# Patient Record
Sex: Male | Born: 1990 | Race: White | Hispanic: No
Health system: Southern US, Community
[De-identification: ages and names within clinical notes are randomized; demographics above are authoritative.]

---

## 2009-03-20 ENCOUNTER — Encounter: Admission: RE | Admit: 2009-03-20 | Discharge: 2009-03-20 | Payer: Self-pay

## 2010-07-28 IMAGING — CT CT EXTREM LOW W/O CM*R*
4 series · 16 of 33 positions shown, 19 images · non-contrast
Comparison: None

CLINICAL DATA: 18-year-old with right foot pain for 6 months.  No
known injury.  Question tarsal coalition.

CT OF THE RIGHT FOOT WITHOUT CONTRAST
TECHNIQUE: Multidetector CT imaging of the right foot was
performed according to the standard protocol without intravenous
contrast. Multiplanar CT image reconstructions were also generated.

[Series 3: bone · axial · 0.48mm/px · z∈[+575,+655]mm · 5 of 48 slices shown, 7 images]
[im 8/48  soft-tissue]
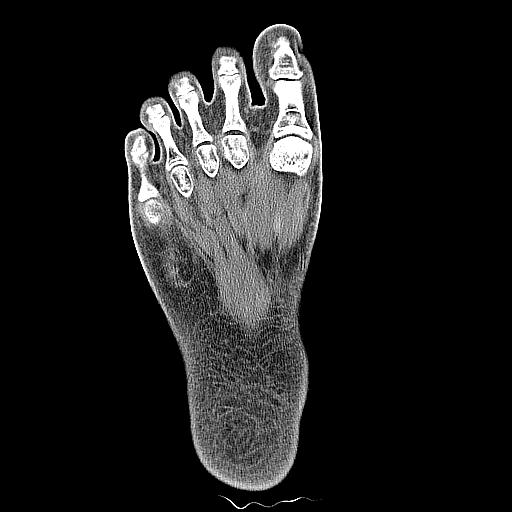
[im 8/48  bone]
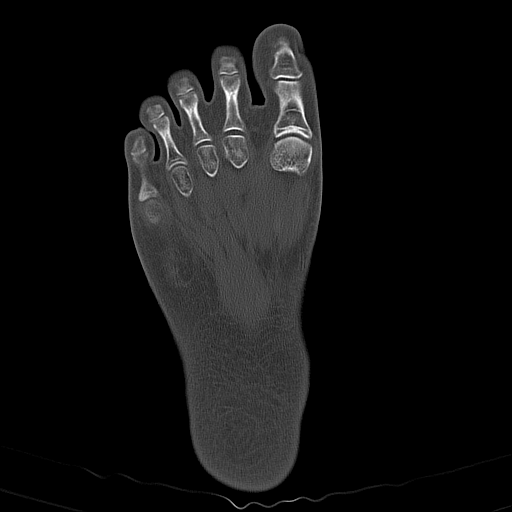
[im 16/48  bone]
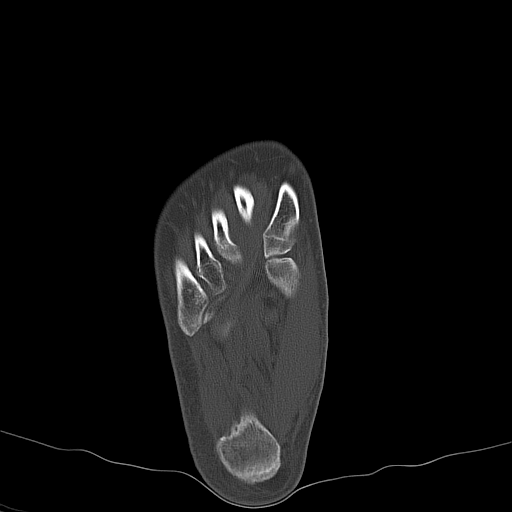
[im 24/48  bone]
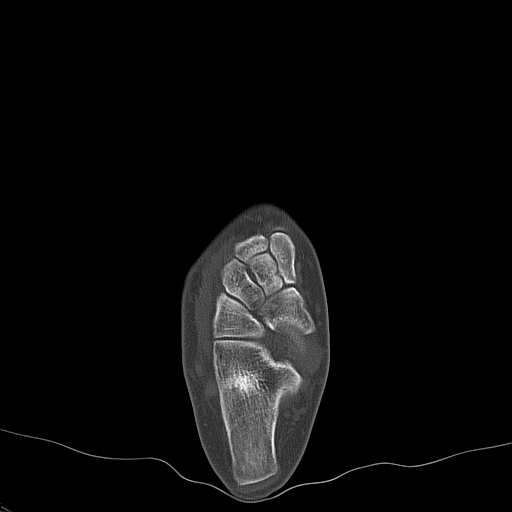
[im 32/48  bone]
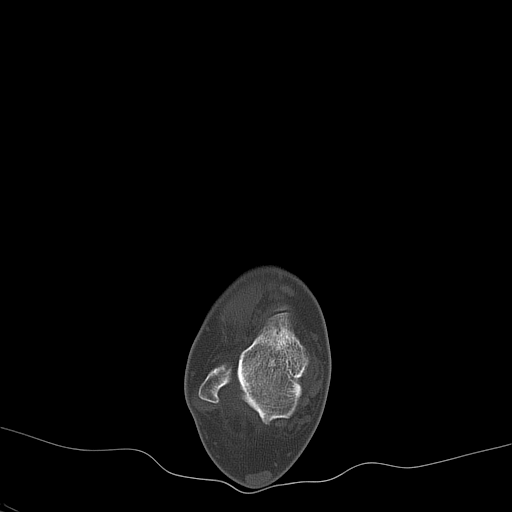
[im 40/48  soft-tissue]
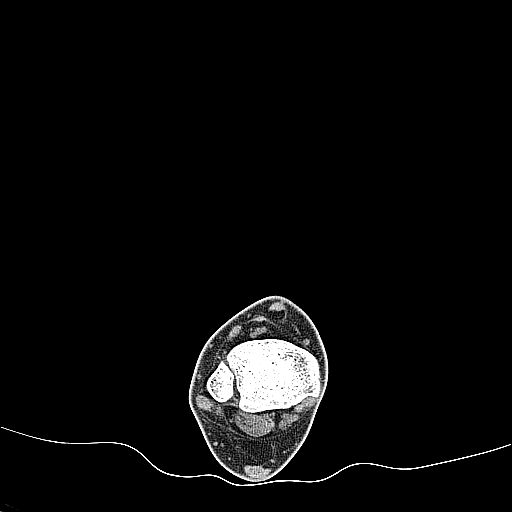
[im 40/48  bone]
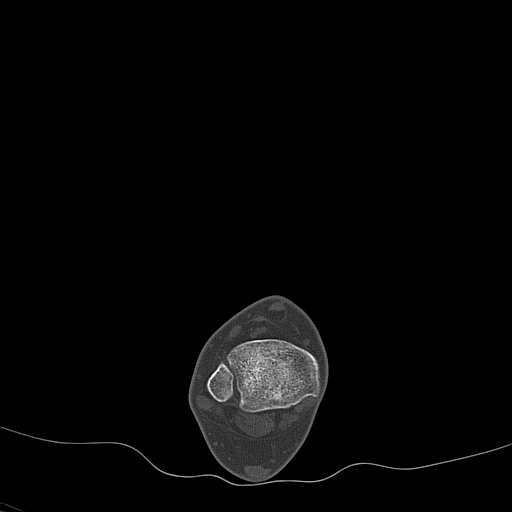

[Series 4: detail · axial · 0.48mm/px · z∈[+575,+615]mm · 3 of 48 slices shown]
[im 8/48  bone]
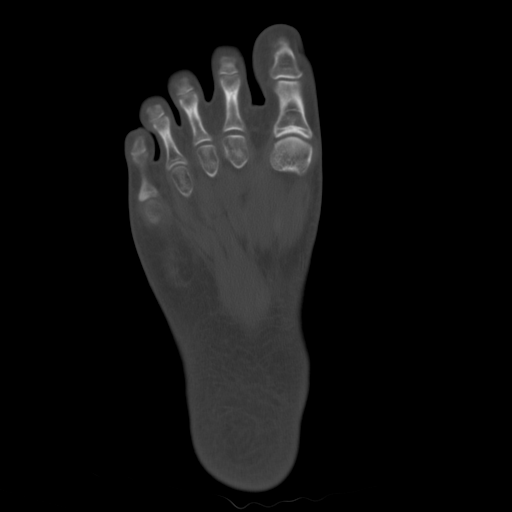
[im 16/48  bone]
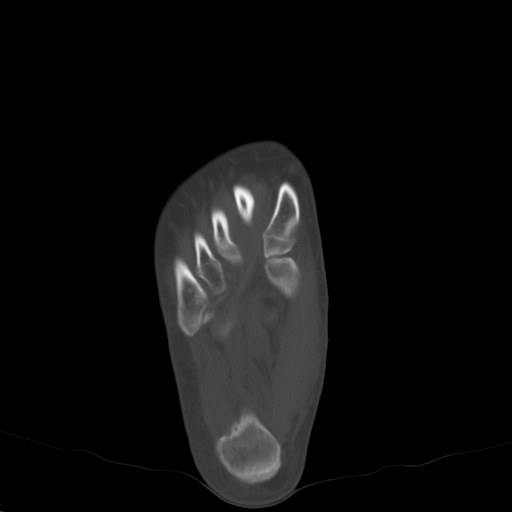
[im 24/48  bone]
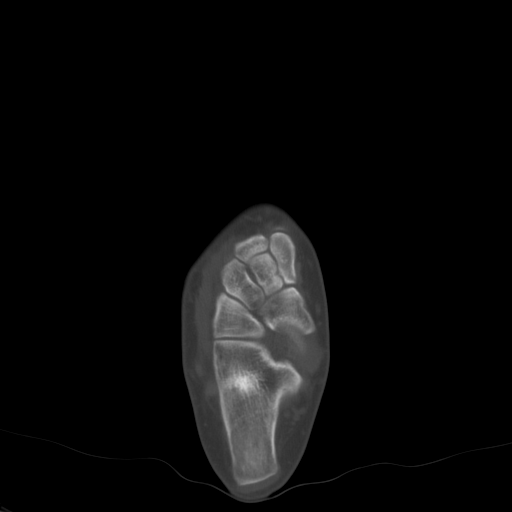

[coronals · coronal · 0.34mm/px · 3 of 112 slices shown]
[im 23/112  bone]
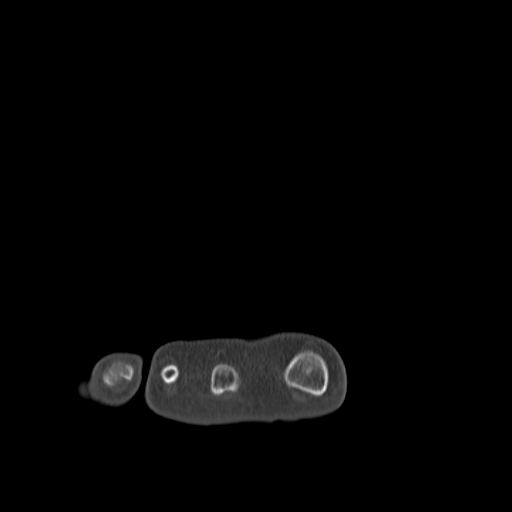
[im 45/112  bone]
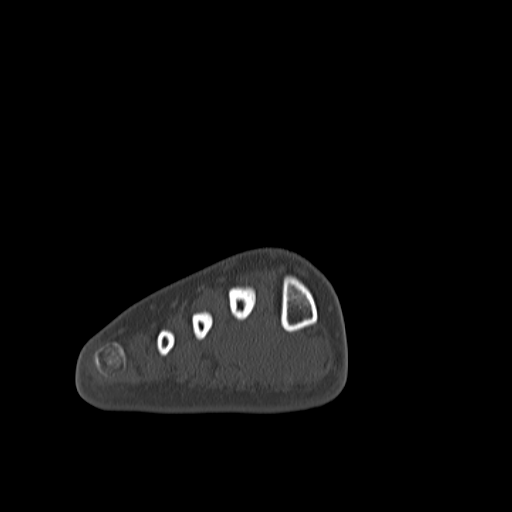
[im 67/112  bone]
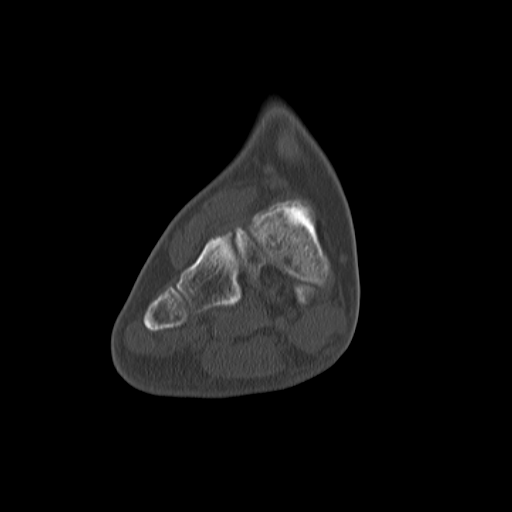

[sags · sagittal · 0.47mm/px · 5 of 50 slices shown, 6 images]
[im 17/50  bone]
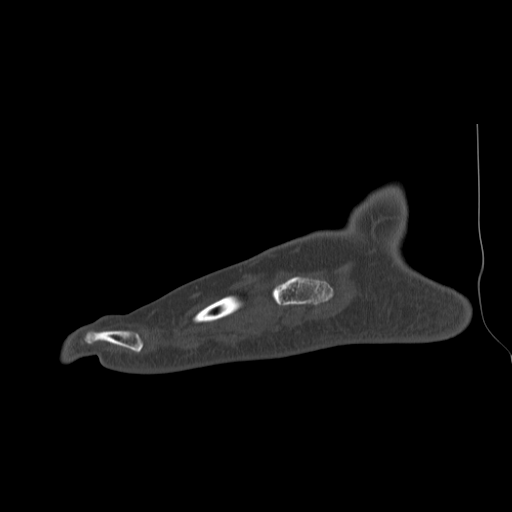
[im 21/50  bone]
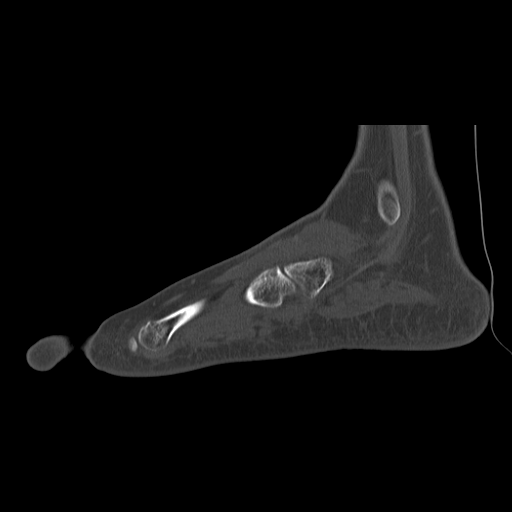
[im 25/50  soft-tissue]
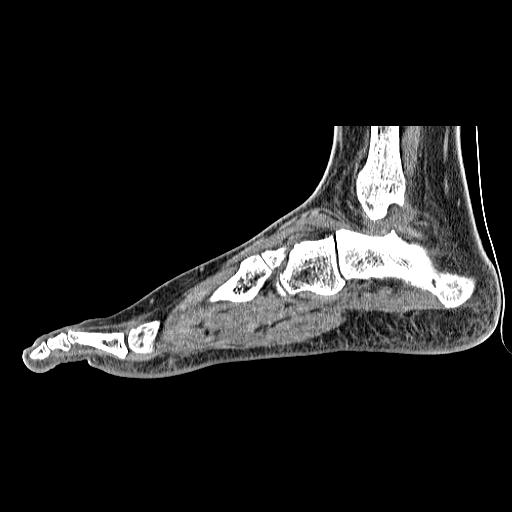
[im 25/50  bone]
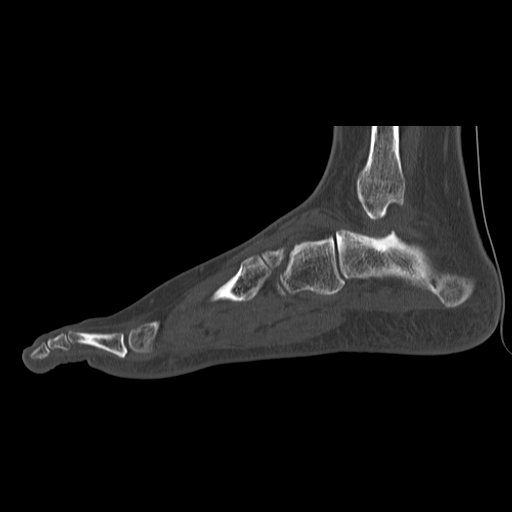
[im 29/50  bone]
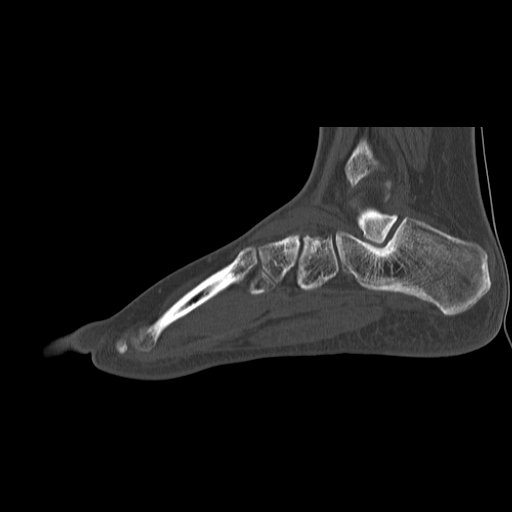
[im 33/50  bone]
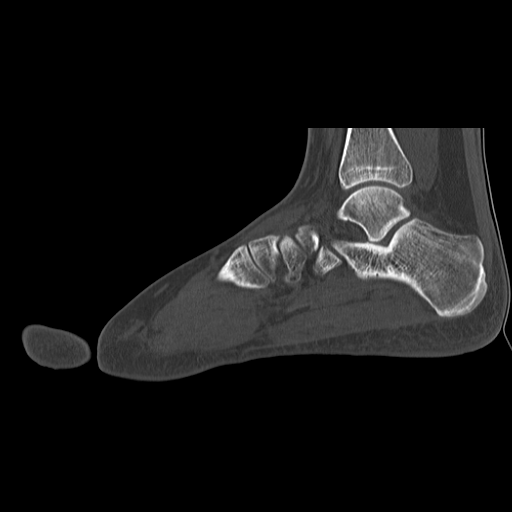

[16 of 33 positions shown; findings below may reference images not displayed]

FINDINGS: The anterior, middle and posterior facet of the subtalar
joint appear normal.  There is no talar beaking.  There is no
evidence of calcaneonavicular coalition.  The talar dome, tibial
plafond and tarsal sinus appear normal.

There is a type 1 accessory navicular.  The posterior tibialis
tendon appears unremarkable as evaluated by CT.  No other tendon
abnormalities are identified.  There is no ankle joint effusion.
The metatarsals and sesamoids appear normal.
IMPRESSION: Normal examination.  No evidence of tarsal coalition.

3-DIMENSIONAL CT IMAGE RENDERING AT INDEPENDENT WORKSTATION:

3-dimensional CT images were rendered by post-processing of the
original CT data at independent workstation.  The 3-dimensional CT
images were interpreted, and findings were reported in the
accompanying complete CT report for this study.

## 2015-08-10 ENCOUNTER — Telehealth: Payer: Self-pay | Admitting: *Deleted

## 2015-08-10 NOTE — Telephone Encounter (Signed)
Note opened in error,  Wrong pt/ same name
